# Patient Record
Sex: Male | Born: 2000 | Race: Black or African American | Hispanic: No | Marital: Single
Health system: Southern US, Community
[De-identification: ages and names within clinical notes are randomized; demographics above are authoritative.]

## PROBLEM LIST (undated history)

## (undated) DIAGNOSIS — F909 Attention-deficit hyperactivity disorder, unspecified type: Secondary | ICD-10-CM

## (undated) DIAGNOSIS — T7840XA Allergy, unspecified, initial encounter: Secondary | ICD-10-CM

## (undated) DIAGNOSIS — H539 Unspecified visual disturbance: Secondary | ICD-10-CM

## (undated) HISTORY — DX: Unspecified visual disturbance: H53.9

## (undated) HISTORY — DX: Allergy, unspecified, initial encounter: T78.40XA

---
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
## 2022-03-11 ENCOUNTER — Emergency Department (HOSPITAL_COMMUNITY)
Admission: EM | Admit: 2022-03-11 | Discharge: 2022-03-11 | Disposition: A | Discharge status: Home / Self Care | Payer: Self-pay | Attending: Emergency Medicine | Admitting: Emergency Medicine

## 2022-03-11 ENCOUNTER — Other Ambulatory Visit: Payer: Self-pay

## 2022-03-11 ENCOUNTER — Encounter (HOSPITAL_COMMUNITY): Payer: Self-pay

## 2022-03-11 DIAGNOSIS — H5713 Ocular pain, bilateral: Secondary | ICD-10-CM | POA: Insufficient documentation

## 2022-03-11 DIAGNOSIS — R519 Headache, unspecified: Secondary | ICD-10-CM | POA: Insufficient documentation

## 2022-03-11 DIAGNOSIS — H53149 Visual discomfort, unspecified: Secondary | ICD-10-CM | POA: Insufficient documentation

## 2022-03-11 MED ORDER — FLUORESCEIN SODIUM 1 MG OP STRP
1.0000 | ORAL_STRIP | Freq: Once | OPHTHALMIC | Status: AC
  Administered 2022-03-11: 1 via OPHTHALMIC
  Filled 2022-03-11: qty 1

## 2022-03-11 MED ORDER — TETRACAINE HCL 0.5 % OP SOLN
2.0000 [drp] | Freq: Once | OPHTHALMIC | Status: AC
  Administered 2022-03-11: 2 [drp] via OPHTHALMIC
  Filled 2022-03-11: qty 4

## 2022-03-11 MED ORDER — ACETAMINOPHEN 500 MG PO TABS
1000.0000 mg | ORAL_TABLET | Freq: Once | ORAL | Status: AC
  Administered 2022-03-11: 1000 mg via ORAL
  Filled 2022-03-11: qty 2

## 2022-03-11 NOTE — Discharge Instructions (Signed)
You came to the emergency department today to be evaluated for your eye pain.  Please go directly to [facility] for further evaluation of your pain by a ophthalmologist.  The exact cause of your eye pain is unknown at this time, if you do not follow-up with the ophthalmologist there is a chance that your eye pain can become worse and you may develop complications including but not limited to loss of vision.
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
## 2022-03-11 NOTE — ED Triage Notes (Signed)
Pt arrived POV from home c/o eye pain tht started yesterday. Pt states his eye was irritated all day yesterday but then today woke up and is sensitive to light and has a bad headache. Pt states he is unable to open his eyes d/t pain and light sensitivity.

## 2022-07-17 ENCOUNTER — Emergency Department (HOSPITAL_COMMUNITY): Payer: Self-pay

## 2022-07-17 ENCOUNTER — Emergency Department (HOSPITAL_COMMUNITY)
Admission: EM | Admit: 2022-07-17 | Discharge: 2022-07-18 | Disposition: A | Discharge status: Home / Self Care | Payer: Self-pay | Attending: Emergency Medicine | Admitting: Emergency Medicine

## 2022-07-17 DIAGNOSIS — S0990XA Unspecified injury of head, initial encounter: Secondary | ICD-10-CM | POA: Insufficient documentation

## 2022-07-17 DIAGNOSIS — F10220 Alcohol dependence with intoxication, uncomplicated: Secondary | ICD-10-CM | POA: Insufficient documentation

## 2022-07-17 DIAGNOSIS — Y92149 Unspecified place in prison as the place of occurrence of the external cause: Secondary | ICD-10-CM | POA: Diagnosis not present

## 2022-07-17 DIAGNOSIS — W2201XA Walked into wall, initial encounter: Secondary | ICD-10-CM | POA: Insufficient documentation

## 2022-07-17 DIAGNOSIS — R410 Disorientation, unspecified: Secondary | ICD-10-CM | POA: Diagnosis present

## 2022-07-17 DIAGNOSIS — S098XXA Other specified injuries of head, initial encounter: Secondary | ICD-10-CM

## 2022-07-17 DIAGNOSIS — F10929 Alcohol use, unspecified with intoxication, unspecified: Secondary | ICD-10-CM

## 2022-07-17 LAB — BASIC METABOLIC PANEL
Anion gap: 12 (ref 5–15)
BUN: 8 mg/dL (ref 6–20)
CO2: 24 mmol/L (ref 22–32)
Calcium: 9 mg/dL (ref 8.9–10.3)
Chloride: 107 mmol/L (ref 98–111)
Creatinine, Ser: 0.85 mg/dL (ref 0.61–1.24)
GFR, Estimated: >60 mL/min (ref >60)
Glucose, Bld: 98 mg/dL (ref 70–99)
Potassium: 3.6 mmol/L (ref 3.5–5.1)
Sodium: 143 mmol/L (ref 135–145)

## 2022-07-17 LAB — CBC WITH DIFFERENTIAL/PLATELET
Abs Immature Granulocytes: 0.02 10*3/uL (ref 0.00–0.07)
Basophils Absolute: 0 10*3/uL (ref 0.0–0.1)
Basophils Relative: 1 %
Eosinophils Absolute: 0.2 10*3/uL (ref 0.0–0.5)
Eosinophils Relative: 3 %
HCT: 41 % (ref 39.0–52.0)
Hemoglobin: 14.3 g/dL (ref 13.0–17.0)
Immature Granulocytes: 0 %
Lymphocytes Relative: 32 %
Lymphs Abs: 1.9 10*3/uL (ref 0.7–4.0)
MCH: 31.8 pg (ref 26.0–34.0)
MCHC: 34.9 g/dL (ref 30.0–36.0)
MCV: 91.1 fL (ref 80.0–100.0)
Monocytes Absolute: 0.5 10*3/uL (ref 0.1–1.0)
Monocytes Relative: 8 %
Neutro Abs: 3.3 10*3/uL (ref 1.7–7.7)
Neutrophils Relative %: 56 %
Platelets: 317 10*3/uL (ref 150–400)
RBC: 4.5 MIL/uL (ref 4.22–5.81)
RDW: 12.1 % (ref 11.5–15.5)
WBC: 5.9 10*3/uL (ref 4.0–10.5)
nRBC: 0 % (ref 0.0–0.2)

## 2022-07-17 LAB — RAPID URINE DRUG SCREEN, HOSP PERFORMED
Amphetamines: NOT DETECTED
Barbiturates: NOT DETECTED
Benzodiazepines: POSITIVE — AB
Cocaine: NOT DETECTED
Opiates: NOT DETECTED
Tetrahydrocannabinol: POSITIVE — AB

## 2022-07-17 LAB — ETHANOL: Alcohol, Ethyl (B): 187 mg/dL — ABNORMAL HIGH (ref <10)

## 2022-07-17 LAB — MAGNESIUM: Magnesium: 2 mg/dL (ref 1.7–2.4)

## 2022-07-17 MED ORDER — SODIUM CHLORIDE 0.9 % IV BOLUS
1000.0000 mL | Freq: Once | INTRAVENOUS | Status: AC
  Administered 2022-07-17: 1000 mL via INTRAVENOUS

## 2022-07-17 MED ORDER — DIPHENHYDRAMINE HCL 50 MG/ML IJ SOLN
50.0000 mg | Freq: Once | INTRAMUSCULAR | Status: AC
Start: 2022-07-17 — End: 2022-07-17
  Administered 2022-07-17: 50 mg via INTRAMUSCULAR
  Filled 2022-07-17: qty 1

## 2022-07-17 MED ORDER — HALOPERIDOL LACTATE 5 MG/ML IJ SOLN
2.0000 mg | Freq: Once | INTRAMUSCULAR | Status: AC
  Administered 2022-07-17: 2 mg via INTRAMUSCULAR
  Filled 2022-07-17: qty 1

## 2022-07-17 MED ORDER — LORAZEPAM 2 MG/ML IJ SOLN: 2.0000 mg | Freq: Once | INTRAMUSCULAR | Status: DC

## 2022-07-17 MED ORDER — LORAZEPAM 2 MG/ML IJ SOLN
2.0000 mg | Freq: Once | INTRAMUSCULAR | Status: AC
  Administered 2022-07-17: 2 mg via INTRAMUSCULAR
  Filled 2022-07-17: qty 1
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
## 2022-07-17 NOTE — ED Notes (Signed)
Patient assisted to urinate using a urinal . Patient continues to be agitated /combative and attempting to get out of bed.
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
## 2022-07-17 NOTE — Discharge Instructions (Addendum)
Follow up with your doctor
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
## 2022-07-30 NOTE — ED Provider Notes (Signed)
[facility]ERGENCY DEPARTMENT Provider Note   CSN: [id number] Arrival date & time: 07/17/22  1909     History  Chief Complaint  Patient presents with   Combative / ? Drug Use    In Custody / Handcuffed    Ricardo Joseph is a 21 y.o. male.  Patient is a 21 year old male presenting in police custody for agitation and confusion.  Patient was reportedly brought to jail and shortly after started slamming his head against the wall spitting on the officers. Patient previously reported using "molly" today officers today.  At this time patient is thrashing around in the bed, requiring over 5 officers to hold the patient down, spitting on staff members, and yelling.  Unable to verbally de-escalate at this time.  Some erythema to the forehead but no lacerations or abrasions.  The history is provided by the patient. No language interpreter was used.       Home Medications Prior to Admission medications   Medication Sig Start Date End Date Taking? Authorizing Provider  acetaminophen (TYLENOL) 500 MG tablet Take 1,500 mg by mouth 2 (two) times daily as needed for mild pain or moderate pain.    [provider]  Polyethyl Glycol-Propyl Glycol (SYSTANE OP) Place 1 drop into the right eye daily as needed (irritation).    [provider]      Allergies    Patient has no known allergies.    Review of Systems   Review of Systems  Unable to perform ROS: Mental status change  Psychiatric/Behavioral:  Positive for agitation.     Physical Exam Updated Vital Signs BP (!) 103/56   Pulse 60   Temp (!) 97.2 F (36.2 C) (Axillary)   Resp 16   SpO2 96%  Physical Exam Vitals and nursing note reviewed.  Constitutional:      Appearance: He is well-developed.  HENT:     Head: Normocephalic and atraumatic.   Eyes:     Conjunctiva/sclera: Conjunctivae normal.  Cardiovascular:     Rate and Rhythm: Normal rate and regular rhythm.     Heart sounds: No  murmur heard. Pulmonary:     Effort: Pulmonary effort is normal. No respiratory distress.     Breath sounds: Normal breath sounds.  Abdominal:     Palpations: Abdomen is soft.     Tenderness: There is no abdominal tenderness.  Musculoskeletal:        General: No swelling.     Cervical back: Neck supple. No bony tenderness.     Thoracic back: No bony tenderness.     Lumbar back: No bony tenderness.  Skin:    General: Skin is warm and dry.     Capillary Refill: Capillary refill takes less than 2 seconds.  Neurological:     Mental Status: He is alert. He is disoriented and confused.     GCS: GCS eye subscore is 4. GCS verbal subscore is 4. GCS motor subscore is 6.     Comments: Does not cooperate with neurological exam but is moving all 4 extremities spontaneously and without difficulty.  Psychiatric:        Mood and Affect: Mood normal. Affect is angry.        Behavior: Behavior is agitated and aggressive.     ED Results / Procedures / Treatments   Labs (all labs ordered are listed, but only abnormal results are displayed) Labs Reviewed  ETHANOL - Abnormal; Notable for the following components:  Result Value   Alcohol, Ethyl (B) 187 (*)    All other components within normal limits  RAPID URINE DRUG SCREEN, HOSP PERFORMED - Abnormal; Notable for the following components:   Benzodiazepines POSITIVE (*)    Tetrahydrocannabinol POSITIVE (*)    All other components within normal limits  CBC WITH DIFFERENTIAL/PLATELET  BASIC METABOLIC PANEL  MAGNESIUM    EKG EKG Interpretation  Date/Time:  Friday July 17 2022 20:20:34 EDT Ventricular Rate:  81 PR Interval:  156 QRS Duration: 99 QT Interval:  370 QTC Calculation: 430 R Axis:   74 Text Interpretation: Sinus rhythm Consider left atrial enlargement Borderline ST elevation, anterolateral leads No old tracing to compare Confirmed by Melene Plan [phone]) on 07/17/2022 11:25:48 PM  Radiology No results  found.  Procedures Procedures    Medications Ordered in ED Medications  haloperidol lactate (HALDOL) injection 2 mg (2 mg Intramuscular Given 07/17/22 1923)  diphenhydrAMINE (BENADRYL) injection 50 mg (50 mg Intramuscular Given 07/17/22 1923)  LORazepam (ATIVAN) injection 2 mg (2 mg Intramuscular Given 07/17/22 2013)  sodium chloride 0.9 % bolus 1,000 mL (0 mLs Intravenous Stopped 07/18/22 0041)    ED Course/ Medical Decision Making/ A&P Clinical Course as of 07/30/22 2500  Fri Jul 17, 2022  1916 Patient is brought to the emergency department due to concern for about a behavior versus TBI.  Per EMS report and please officers, patient was reportedly brought to the emergency department after being placed in custody today.  Patient was arrested by police and brought to jail.  He reports that he did "[employer]" earlier in the day.  Unfortunate states that he repeatedly bashed his head against the wall at the jail which is what prompted the police department to bring the patient to the ED for evaluation.  Per EMS, patient was combative and agitated.  He required 5 mg of IM Versed due to agitation and being violent with EMS. [JN]  2136 On arrival to the emergency department, patient was agitated and aggressive, patient was in handcuffs by PD.  Patient required chemical sedation due to agitation.  Patient was given Haldol and Benadryl as he had already received Versed with EMS.  Unfortunately patient was still extremely agitated and required another 2 mg of IV Ativan.  Patient was then appropriately sedated placed in soft restraints. [JN]  2137 Between imaging study work-up obtained due to patient's reported self-induced head injury, CT head will be ordered to assess patient intracranial normality. [JN]  2137 EKG unremarkable, QRS/QTc/PR interval appropriate, no evidence of ST elevation or depression. [JN]    Clinical Course User Index [JN] Nogle, [place], MD                           Medical Decision  Making Amount and/or Complexity of Data Reviewed Labs: ordered. Radiology: ordered.  Risk Prescription drug management.   34:13 AM 21 year old male presenting in police custody for agitation and confusion.  Patient is aggressive, confused, yelling, spitting, and swearing.  Unable to be verbally de-escalated at this time.  Will order restraints and use chemical sedation to ensure no TBI after blunt head injury today in jail.  Patient resting comfortably in bed at this time. CT head demonstrates no acute process. Ethanol positive.  UDS positive for THC and benzos.  Patient signed out to oncoming physician while awaiting for sedating medications to wear off for reevaluation of an neurological exam.  For discharge to police custody if normal.  Final Clinical Impression(s) / ED Diagnoses Final diagnoses:  Alcoholic intoxication with complication [facility])  Blunt head trauma, initial encounter    Rx / DC Orders ED Discharge Orders     None         Franne Forts, DO 07/30/22 [phone]

## 2022-08-28 ENCOUNTER — Encounter: Payer: Self-pay | Admitting: *Deleted

## 2022-08-28 NOTE — Congregational Nurse Program (Signed)
  Dept: [phone]   Congregational Nurse Program Note  Date of Encounter: 08/28/2022  Past Medical History: Past Medical History:  Diagnosis Date   Allergy    Vision abnormalities    wears contacts    Encounter Details:  CNP Questionnaire - 08/28/22 1403       Questionnaire   Ask client: Do you give verbal consent for me to treat you today? Yes    Student Assistance N/A    Location Patient Served  [facility]    Visit Setting with Client Organization;Phone/Text/Email    Patient Status Unknown    Insurance Unknown   see CN note   Insurance/Financial Assistance Referral N/A    Medication N/A    Medical Provider No    Screening Referrals Made N/A    Medical Referrals Made [facility]    Medical Appointment Made N/A    Recently w/o PCP, now 1st time PCP visit completed due to CNs referral or appointment made N/A    Food N/A    Transportation N/A    Housing/Utilities N/A    Interpersonal Safety N/A    Interventions Advocate/Support;Navigate Healthcare System    Abnormal to Normal Screening Since Last CN Visit N/A    Screenings CN Performed N/A    Sent Client to Lab for: N/A    Did client attend any of the following based off CNs referral or appointments made? N/A    ED Visit Averted N/A    Life-Saving Intervention Made N/A            Client came to nurse's office asking about behavioral health services. Client reports he has had increased mood swings and conflicts with others wanting help with anger.Client is working and reports he should be eligible for insurance in a month. Contacted [facility] OP office and left message to call client. Gave [facility] information to client. He denies si and hi. Ricardo Joseph W RN CN

## 2022-09-26 ENCOUNTER — Ambulatory Visit (HOSPITAL_COMMUNITY)
Admission: EM | Admit: 2022-09-26 | Discharge: 2022-09-26 | Disposition: A | Discharge status: Home / Self Care | Payer: No Payment, Other | Attending: Student | Admitting: Student

## 2022-09-26 DIAGNOSIS — F4310 Post-traumatic stress disorder, unspecified: Secondary | ICD-10-CM | POA: Insufficient documentation

## 2022-09-26 DIAGNOSIS — R4587 Impulsiveness: Secondary | ICD-10-CM | POA: Insufficient documentation

## 2022-09-26 DIAGNOSIS — R4586 Emotional lability: Secondary | ICD-10-CM | POA: Insufficient documentation

## 2022-09-26 DIAGNOSIS — R454 Irritability and anger: Secondary | ICD-10-CM

## 2022-09-26 MED ORDER — ARIPIPRAZOLE 5 MG PO TABS: 5.0000 mg | ORAL_TABLET | Freq: Every day | ORAL | 0 refills | Status: DC

## 2022-09-26 NOTE — Progress Notes (Signed)
Ricardo Joseph,  Routine: is a 21 year old male  who presents voluntarily at [facility]  requesting an evaluation for possible bipolar dx. Pt. was denies any SI/HI/AVH.  Pt. reports a dx of ADHD with a strong family hx of bipolar .  He denies any recent substance use but will sometimes drink excessively to numb his emotions. Pt. was alert and oriented, engaged.    09/26/22 1514  [facility] Triage Screening (Walk-ins at [facility] only)  What Is the Reason for Your Visit/Call Today? Ricardo Joseph is a 21 year old male  who presents voluntarily at [facility]  requesting an evaluation for possible bipolar dx. Pt. was denies any SI/HI/AVH Pt. reports a dx of ADHD but a strong family hx of bipolar.  He denies any recent substance use but will sometimes drink excessively to numb his emotions. Pt. was alert and oriented, engaged  How Long Has This Been Causing You Problems? > than 6 months  Have You Recently Had Any Thoughts About Hurting Yourself? No  Are You Planning to Commit Suicide/Harm Yourself At This time? No  Have you Recently Had Thoughts About Hurting Someone Karolee Ohs? No  Are You Planning To Harm Someone At This Time? No  Are you currently experiencing any auditory, visual or other hallucinations? No  Have You Used Any Alcohol or Drugs in the Past 24 Hours? No (Pt. admitted he sometimes will drink to numb his emotions)  Do you have any current medical co-morbidities that require immediate attention? No  What Do You Feel Would Help You the Most Today? Treatment for Depression or other mood problem  If access to [facility] was not available, would you have sought care in the Emergency Department? No  Determination of Need Routine (7 days)  Options For Referral Medication Management;Outpatient Therapy

## 2022-09-26 NOTE — ED Provider Notes (Cosign Needed)
Behavioral Health Urgent Care Medical Screening Exam  Patient Name: Ricardo Joseph MRN: [id number] Date of Evaluation: 09/26/22 Chief Complaint:  "evaluation for bipolar disorder" Diagnosis:  Final diagnoses:  PTSD (post-traumatic stress disorder)  Difficulty controlling anger    History of Present illness: Ricardo Joseph is a 21 y.o. male with a psychiatric history of ADHD, unspecified mood disorder, aggressive behaviors, alcohol use disorder, and marijuana use disorder who presented to the [facility] voluntarily for "evaluation for bipolar disorder."  On assessment today, patient reports mood lability that often results in anger which he feels is out of his control. This has led to him making decisions that have resulted in arrests. Patient reports a primary trigger to be someone raising a hand to him, which reminds him of the physical, emotional, and verbal trauma endured in childhood, then he develops difficulty deciphering between past and present, reality and memories. As well during this time, patient has an increase in alcohol intake and marijuana use to "numb the pain." He reports that he used to have nightmares and used to hear voices as a child, but it has been a while since. In addition to trauma, he reports that he was involved with the wrong crowd and was imprisoned at 59 or 21 years old for four years. Since, he has found it difficult to control and properly process his emotions. Patient does report fear about his anger, stating that when it escalates, he has no regard for anyone else.   Patient denies major depressive episodes in which depressive symptoms persisted for at least 2 weeks. They last a maximum of 2-3 days. He also denies symptoms of mania including impulsivity, grandiosity, decreased need for sleep, increased energy, and talkativeness ongoing for days on end. These symptoms typically last no more than 24 hours. He makes impulsive decisions, but these appear to be more so  one day at a time rather than ongoing.   Zanarini Rating Scale for Boderline Personality Disorder: 7.  Flowsheet Row ED from 09/26/2022 in [facility] ED from 03/11/2022 in [facility] EMERGENCY DEPARTMENT  C-SSRS RISK CATEGORY No Risk No Risk       Psychiatric Specialty Exam  Presentation  General Appearance:Appropriate for Environment; Well Groomed  Eye Contact:Good  Speech:Clear and Coherent; Normal Rate  Speech Volume:Normal  Handedness:Right   Mood and Affect  Mood:Anxious; Angry  Affect:Congruent   Thought Process  Thought Processes:Goal Directed  Descriptions of Associations:Intact  Orientation:Full (Time, Place and Person)  Thought Content:Perseveration    Hallucinations:None  Ideas of Reference:None  Suicidal Thoughts:Yes, Passive Without Intent; Without Plan  Homicidal Thoughts:No   Sensorium  Memory:Immediate Good; Recent Good  Judgment:Fair  Insight:Poor; Shallow   Executive Functions  Concentration:Good  Attention Span:Good  Recall:Good  Fund of Knowledge:Fair  Language:Good   Psychomotor Activity  Psychomotor Activity:Normal   Assets  Assets:Housing; Desire for Improvement; Physical Health; Resilience; Social Support; Vocational/Educational   Sleep  Sleep:Good  Number of hours: No data recorded  No data recorded  Physical Exam: Physical Exam Vitals reviewed.  Constitutional:      General: He is not in acute distress.    Appearance: He is not ill-appearing.  HENT:     Head: Normocephalic and atraumatic.     Mouth/Throat:     Mouth: Mucous membranes are moist.     Pharynx: Oropharynx is clear.  Pulmonary:     Effort: Pulmonary effort is normal.  Skin:    General: Skin is warm and dry.  Neurological:     General: No focal deficit present.     Mental Status: He is alert.     Motor: No weakness.     Gait: Gait normal.    Review of Systems  Constitutional:   Negative for malaise/fatigue.  HENT:  Negative for congestion.   Cardiovascular:  Positive for palpitations. Negative for chest pain.       Palpitations 2/2 anxiety  Gastrointestinal: Negative.   Genitourinary: Negative.   Neurological:  Negative for dizziness, weakness and headaches.   There were no vitals taken for this visit. There is no height or weight on file to calculate BMI.  Musculoskeletal: Strength & Muscle Tone: within normal limits Gait & Station: normal Patient leans: N/A   [facility] MSE Discharge Disposition for Follow up and Recommendations: Based on my evaluation the patient does not appear to have an emergency medical condition and can be discharged with resources and follow up care in outpatient services for Medication Management and Individual Therapy  Based on assessment, patient does not have a diagnosis of bipolar disorder. Rather, his presentation is consistent with PTSD. As well, he has difficulty controlling his anger. Patient has not seen a therapist and has not seen a psychiatrist since he was a child. We discuss at length the need for medication management and therapy. He is initially reluctant to seek further care but is ultimately agreeable.  -Patient started on Abilify 5 mg daily.  -Patient scheduled to follow-up with this writer Feb 7 at 2 PM.  -Patient advised to walk-in for therapy or if needs to be seen sooner for medication management.  -Patient given return to hospital protocol.  -Patient counseled on marijuana, alcohol, and tobacco cessation.    Lamar Sprinkles, MD PGY-2 09/26/2022, 9:00 PM

## 2022-09-26 NOTE — Discharge Instructions (Addendum)
Follow-up recommendations:  Activity:  Normal, as tolerated Diet:  Per PCP recommendation  Patient is instructed prior to discharge to: Take all medications as prescribed by his mental healthcare provider. Report any adverse effects and/or reactions from the medicines to his outpatient provider promptly. Patient has been instructed & cautioned: To not engage in alcohol and or illegal drug use while on prescription medicines.  In the event of worsening symptoms, patient is instructed to call the crisis hotline at 988, 911 and or go to the nearest ED for appropriate evaluation and treatment of symptoms. To follow-up with his primary care provider for your other medical issues, concerns and or health care needs.   [facility] OUTPATIENT Walk-in information:  Please note, all walk-ins are first come & first serve, with limited number of availability. Therapist for therapy:  Monday & Wednesdays: Please ARRIVE at 7:15 AM for registration Will START at 8:00 AM Every 1st & 2nd Friday of the month: Please ARRIVE at 10:15 AM for registration Will START at 1 PM - 5 PM Psychiatrist for medication management: Monday - Friday:  Please ARRIVE at 7:15 AM for registration Will START at 8:00 AM       Regretfully, due to limited availability, please be aware that you may not been seen on the same day as walk-in. Please consider making an appoint or try again. Thank you for your patience and understanding.  

## 2022-09-29 ENCOUNTER — Ambulatory Visit (HOSPITAL_COMMUNITY)
Admission: EM | Admit: 2022-09-29 | Discharge: 2022-09-29 | Disposition: A | Discharge status: Home / Self Care | Payer: No Payment, Other | Attending: Psychiatry | Admitting: Psychiatry

## 2022-09-29 DIAGNOSIS — Z818 Family history of other mental and behavioral disorders: Secondary | ICD-10-CM | POA: Insufficient documentation

## 2022-09-29 DIAGNOSIS — F4325 Adjustment disorder with mixed disturbance of emotions and conduct: Secondary | ICD-10-CM | POA: Insufficient documentation
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
## 2022-09-29 NOTE — Discharge Instructions (Addendum)

## 2022-11-04 ENCOUNTER — Ambulatory Visit (HOSPITAL_COMMUNITY): Payer: No Payment, Other | Admitting: Student

## 2023-01-24 ENCOUNTER — Emergency Department (HOSPITAL_COMMUNITY): Payer: Medicaid Other

## 2023-01-24 ENCOUNTER — Other Ambulatory Visit: Payer: Self-pay

## 2023-01-24 ENCOUNTER — Emergency Department (HOSPITAL_COMMUNITY)
Admission: EM | Admit: 2023-01-24 | Discharge: 2023-01-24 | Disposition: A | Discharge status: Home / Self Care | Payer: Medicaid Other | Attending: Emergency Medicine | Admitting: Emergency Medicine

## 2023-01-24 ENCOUNTER — Encounter (HOSPITAL_COMMUNITY): Payer: Self-pay

## 2023-01-24 DIAGNOSIS — W3400XA Accidental discharge from unspecified firearms or gun, initial encounter: Secondary | ICD-10-CM

## 2023-01-24 DIAGNOSIS — S71132D Puncture wound without foreign body, left thigh, subsequent encounter: Secondary | ICD-10-CM

## 2023-01-24 DIAGNOSIS — S71142A Puncture wound with foreign body, left thigh, initial encounter: Secondary | ICD-10-CM | POA: Insufficient documentation

## 2023-01-24 DIAGNOSIS — M79642 Pain in left hand: Secondary | ICD-10-CM | POA: Diagnosis not present

## 2023-01-24 DIAGNOSIS — S71102D Unspecified open wound, left thigh, subsequent encounter: Secondary | ICD-10-CM | POA: Diagnosis present

## 2023-01-24 DIAGNOSIS — Z23 Encounter for immunization: Secondary | ICD-10-CM | POA: Insufficient documentation

## 2023-01-24 DIAGNOSIS — W3400XD Accidental discharge from unspecified firearms or gun, subsequent encounter: Secondary | ICD-10-CM | POA: Diagnosis not present

## 2023-01-24 HISTORY — DX: Attention-deficit hyperactivity disorder, unspecified type: F90.9

## 2023-01-24 LAB — BASIC METABOLIC PANEL
Anion gap: 16 — ABNORMAL HIGH (ref 5–15)
BUN: 13 mg/dL (ref 6–20)
CO2: 19 mmol/L — ABNORMAL LOW (ref 22–32)
Calcium: 9.2 mg/dL (ref 8.9–10.3)
Chloride: 103 mmol/L (ref 98–111)
Creatinine, Ser: 1.37 mg/dL — ABNORMAL HIGH (ref 0.61–1.24)
Glucose, Bld: 138 mg/dL — ABNORMAL HIGH (ref 70–99)
Potassium: 3.2 mmol/L — ABNORMAL LOW (ref 3.5–5.1)
Sodium: 138 mmol/L (ref 135–145)

## 2023-01-24 LAB — I-STAT VENOUS BLOOD GAS, ED
Acid-base deficit: 6 mmol/L — ABNORMAL HIGH (ref 0.0–2.0)
Bicarbonate: 20.8 mmol/L (ref 20.0–28.0)
Calcium, Ion: 1.17 mmol/L (ref 1.15–1.40)
HCT: 49 % (ref 39.0–52.0)
Hemoglobin: 16.7 g/dL (ref 13.0–17.0)
O2 Saturation: 63 %
Potassium: 3.4 mmol/L — ABNORMAL LOW (ref 3.5–5.1)
Sodium: 142 mmol/L (ref 135–145)
TCO2: 22 mmol/L (ref 22–32)
pCO2, Ven: 42.3 mmHg — ABNORMAL LOW (ref 44–60)
pH, Ven: 7.299 (ref 7.25–7.43)
pO2, Ven: 36 mmHg (ref 32–45)

## 2023-01-24 LAB — CBC WITH DIFFERENTIAL/PLATELET
Abs Immature Granulocytes: 0.02 10*3/uL (ref 0.00–0.07)
Basophils Absolute: 0 10*3/uL (ref 0.0–0.1)
Basophils Relative: 1 %
Eosinophils Absolute: 0.3 10*3/uL (ref 0.0–0.5)
Eosinophils Relative: 3 %
HCT: 46.4 % (ref 39.0–52.0)
Hemoglobin: 16.3 g/dL (ref 13.0–17.0)
Immature Granulocytes: 0 %
Lymphocytes Relative: 56 %
Lymphs Abs: 4.7 10*3/uL — ABNORMAL HIGH (ref 0.7–4.0)
MCH: 32.1 pg (ref 26.0–34.0)
MCHC: 35.1 g/dL (ref 30.0–36.0)
MCV: 91.3 fL (ref 80.0–100.0)
Monocytes Absolute: 0.6 10*3/uL (ref 0.1–1.0)
Monocytes Relative: 7 %
Neutro Abs: 2.7 10*3/uL (ref 1.7–7.7)
Neutrophils Relative %: 33 %
Platelets: 318 10*3/uL (ref 150–400)
RBC: 5.08 MIL/uL (ref 4.22–5.81)
RDW: 11.8 % (ref 11.5–15.5)
WBC: 8.3 10*3/uL (ref 4.0–10.5)
nRBC: 0 % (ref 0.0–0.2)

## 2023-01-24 LAB — CBG MONITORING, ED: Glucose-Capillary: 126 mg/dL — ABNORMAL HIGH (ref 70–99)

## 2023-01-24 MED ORDER — FENTANYL CITRATE PF 50 MCG/ML IJ SOSY
100.0000 ug | PREFILLED_SYRINGE | Freq: Once | INTRAMUSCULAR | Status: AC
  Administered 2023-01-24: 100 ug via INTRAVENOUS
  Filled 2023-01-24: qty 2

## 2023-01-24 MED ORDER — IOHEXOL 350 MG/ML SOLN
150.0000 mL | Freq: Once | INTRAVENOUS | Status: AC | PRN
  Administered 2023-01-24: 150 mL via INTRAVENOUS

## 2023-01-24 MED ORDER — IBUPROFEN 600 MG PO TABS: 600.0000 mg | ORAL_TABLET | Freq: Four times a day (QID) | ORAL | 0 refills | Status: DC | PRN

## 2023-01-24 MED ORDER — CYCLOBENZAPRINE HCL 10 MG PO TABS
10.0000 mg | ORAL_TABLET | Freq: Two times a day (BID) | ORAL | 0 refills | Status: DC | PRN
Start: 2023-01-24 — End: 2023-01-25

## 2023-01-24 MED ORDER — TETANUS-DIPHTH-ACELL PERTUSSIS 5-2.5-18.5 LF-MCG/0.5 IM SUSY
0.5000 mL | PREFILLED_SYRINGE | Freq: Once | INTRAMUSCULAR | Status: AC
  Administered 2023-01-24: 0.5 mL via INTRAMUSCULAR
  Filled 2023-01-24: qty 0.5

## 2023-01-24 MED ORDER — FENTANYL CITRATE PF 50 MCG/ML IJ SOSY
PREFILLED_SYRINGE | INTRAMUSCULAR | Status: AC
  Filled 2023-01-24: qty 2

## 2023-01-24 MED ORDER — ACETAMINOPHEN 500 MG PO TABS: 1000.0000 mg | ORAL_TABLET | Freq: Two times a day (BID) | ORAL | 0 refills | Status: DC | PRN

## 2023-01-24 MED ORDER — OXYCODONE-ACETAMINOPHEN 5-325 MG PO TABS
2.0000 | ORAL_TABLET | Freq: Once | ORAL | Status: AC
  Administered 2023-01-24: 2 via ORAL
  Filled 2023-01-24: qty 2
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
## 2023-01-24 NOTE — Progress Notes (Signed)
Orthopedic Tech Progress Note Patient Details:  Ricardo Joseph 02-18-2001 [id number]  Patient ID: [name], male   DOB: 03/17/2001, 22 y.o.   MRN: [id number] I attended trauma page. Trinna Post 01/24/2023, 6:17 AM
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
## 2023-01-25 ENCOUNTER — Ambulatory Visit (HOSPITAL_COMMUNITY)
Admission: EM | Admit: 2023-01-25 | Discharge: 2023-01-25 | Disposition: A | Discharge status: Home / Self Care | Payer: No Payment, Other | Attending: Behavioral Health | Admitting: Behavioral Health

## 2023-01-25 ENCOUNTER — Encounter (HOSPITAL_COMMUNITY): Payer: Self-pay | Admitting: Behavioral Health

## 2023-01-25 ENCOUNTER — Encounter (HOSPITAL_COMMUNITY): Payer: Self-pay

## 2023-01-25 DIAGNOSIS — R45850 Homicidal ideations: Secondary | ICD-10-CM

## 2023-01-25 MED ORDER — ARIPIPRAZOLE 5 MG PO TABS: 5.0000 mg | ORAL_TABLET | Freq: Every day | ORAL | Status: DC

## 2023-01-25 MED ORDER — ACETAMINOPHEN 325 MG PO TABS: 650.0000 mg | ORAL_TABLET | Freq: Four times a day (QID) | ORAL | Status: DC | PRN

## 2023-01-25 MED ORDER — MAGNESIUM HYDROXIDE 400 MG/5ML PO SUSP: 30.0000 mL | Freq: Every day | ORAL | Status: DC | PRN

## 2023-01-25 MED ORDER — TRAZODONE HCL 50 MG PO TABS: 50.0000 mg | ORAL_TABLET | Freq: Every evening | ORAL | Status: DC | PRN

## 2023-01-25 MED ORDER — HYDROXYZINE HCL 25 MG PO TABS: 25.0000 mg | ORAL_TABLET | Freq: Three times a day (TID) | ORAL | Status: DC | PRN

## 2023-01-25 MED ORDER — ALUM & MAG HYDROXIDE-SIMETH 200-200-20 MG/5ML PO SUSP: 30.0000 mL | ORAL | Status: DC | PRN

## 2023-01-25 NOTE — Discharge Instructions (Addendum)
[facility]: Outpatient psychiatric Services  New Patient Assessment and Therapy Walk-in Monday thru Thursday 8:00 am first come first serve until slots are full Every Friday from 1:00 pm to 4:00 pm first come first serve until slots are full  New Patient Psychiatric Medication Management Monday thru Friday from 8:00 am to 11:00 am first come first served until slots are full  For all walk-ins we ask that you arrive by 7:15 am because patients will be seen in there order of arrival.   Availability is limited, and therefore you may not be seen on the same day that you walk in.  Our goal is to serve and meet the needs of our community to the best of our ability.     Based on what you have shared, a list of resources for outpatient therapy and psychiatry is provided below to get you started back on treatment.  It is imperative that you follow through with treatment within 5-7 days from the day of discharge to prevent any further risk to your safety or mental well-being.  You are not limited to the list provided.  In case of an urgent crisis, you may contact the Mobile Crisis Unit with [facility] at [phone].        Outpatient Services for Therapy and Medication Management for Medicaid  [facility] [address] [place], [place], [address] [phone] phone  New Patient Assessment/Therapy Walk-ins Monday and Wednesday: 8am until slots are full. Every 1st and 2nd Friday: 1pm - 5pm  NO ASSESSMENT/THERAPY WALK-INS ON TUESDAYS OR THURSDAYS  New Patient Psychiatry/Medication Management Walk-ins Monday-Friday: 8am-11am  For all walk-ins, we ask that you arrive by 7:30am because patient will be seen in the order of arrival.  Availability is limited; therefore, you may not be seen on the same day that you walk-in.  Our goal is to serve and meet the needs of our community to the best of our ability.   [facility] [address] [place], [place], [address] [phone] phone  [facility] [address] [place], [place], [address] [phone] phone [phone] phone (Aetna, AmeriHealth, Anthem/Elevance, BCBS, Centivo, Cigna/Evernorth, ComPsych, Healthy Blue, Medicaid, Optum, [facility], The Alliance, UMR, UHC, Vaya Health, WellCare, Out of Network)  [facility] [address] [place], [place], [address] [phone] phone (Aetna, Anthem/Elevance, [facility] Options/Carelon, BCBS, [facility], Humana, Magellan, MedCost, Medicaid, Medicare, MultiPlan, Tricare, UMR, UHC)  [facility] [address] [place], [place], [address] [phone] phone (Medicaid, ask about other insurance)  [facility] [address] [place], [place], [address] [phone] phone [phone] fax (Aetna, BCBS , Cigna, Medicaid, [place] Health Choice, UHC, TRICARE, Self-Pay)  [facility] [address] [place], [place], [address] [phone] phone (Aetna, Anthem/Elevance, BCBS, [facility], Cigna/Evernorth, Health Choice, Humana, MedCost, Medicaid, Medicare, Optum, Tricare, UMR, UHC)  [facility] - 6-8 MONTH WAIT FOR THERAPY; SOONER FOR MEDICATION MANAGEMENT [address] [place], [place], [address] [phone] phone (Aetna, AmeriHealth [facility] [place], BCBS, Cigna, Evernorth, Friday Health Plans, [facility], BCBS Healthy Blue, Humana, Magellan Health, Medcost, Medicare, Medicaid, Optum, Tricare, UHC, UHC Community Plan, Wellcare)  [facility] [address] [place], , [address] [phone] phone  [facility] [address] [place], , [address] [phone] phone  [facility] [address] [place], [place],  [address] [phone] phone  [facility] - THERAPY ONLY [address] [place], [place], [address] [phone] phone  [facility] [address] [place], [place], [address] [phone] phone  [facility] [address] [place], [place], [address] [phone] phone [phone] fax  [facility] [address] [place], [place], [address] [phone] phone [phone] fax  [facility] [address] [place], [place], [address] [phone] phone  [facility] [address] [place], [place], [address]  [phone] phone  [facility]  (Adults Only) [address] [place], [place], [address]  [phone] phone [phone] fax   

## 2023-01-25 NOTE — ED Notes (Signed)
Patient is being walked out by NP Marchelle Folks
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
## 2023-01-26 LAB — I-STAT CHEM 8, ED
BUN: 14 mg/dL (ref 6–20)
Calcium, Ion: 1.11 mmol/L — ABNORMAL LOW (ref 1.15–1.40)
Chloride: 104 mmol/L (ref 98–111)
Creatinine, Ser: 1.7 mg/dL — ABNORMAL HIGH (ref 0.61–1.24)
Glucose, Bld: 134 mg/dL — ABNORMAL HIGH (ref 70–99)
HCT: 48 % (ref 39.0–52.0)
Hemoglobin: 16.3 g/dL (ref 13.0–17.0)
Potassium: 3.4 mmol/L — ABNORMAL LOW (ref 3.5–5.1)
Sodium: 142 mmol/L (ref 135–145)
TCO2: 21 mmol/L — ABNORMAL LOW (ref 22–32)

## 2023-06-13 ENCOUNTER — Emergency Department (HOSPITAL_COMMUNITY)
Admission: EM | Admit: 2023-06-13 | Discharge: 2023-06-13 | Disposition: A | Discharge status: Home / Self Care | Payer: Self-pay | Attending: Emergency Medicine | Admitting: Emergency Medicine

## 2023-06-13 ENCOUNTER — Encounter (HOSPITAL_COMMUNITY): Payer: Self-pay

## 2023-06-13 ENCOUNTER — Other Ambulatory Visit: Payer: Self-pay

## 2023-06-13 ENCOUNTER — Emergency Department (HOSPITAL_COMMUNITY): Payer: Self-pay

## 2023-06-13 DIAGNOSIS — S0990XA Unspecified injury of head, initial encounter: Secondary | ICD-10-CM

## 2023-06-13 DIAGNOSIS — F10129 Alcohol abuse with intoxication, unspecified: Secondary | ICD-10-CM | POA: Insufficient documentation

## 2023-06-13 DIAGNOSIS — Y909 Presence of alcohol in blood, level not specified: Secondary | ICD-10-CM | POA: Insufficient documentation

## 2023-06-13 DIAGNOSIS — S0081XA Abrasion of other part of head, initial encounter: Secondary | ICD-10-CM | POA: Insufficient documentation

## 2023-06-13 DIAGNOSIS — S60511A Abrasion of right hand, initial encounter: Secondary | ICD-10-CM | POA: Insufficient documentation

## 2023-06-13 DIAGNOSIS — S40212A Abrasion of left shoulder, initial encounter: Secondary | ICD-10-CM | POA: Insufficient documentation

## 2023-06-13 DIAGNOSIS — S0012XA Contusion of left eyelid and periocular area, initial encounter: Secondary | ICD-10-CM | POA: Insufficient documentation

## 2023-06-13 DIAGNOSIS — F419 Anxiety disorder, unspecified: Secondary | ICD-10-CM | POA: Insufficient documentation

## 2023-06-13 DIAGNOSIS — S60512A Abrasion of left hand, initial encounter: Secondary | ICD-10-CM | POA: Insufficient documentation

## 2023-06-13 DIAGNOSIS — F10920 Alcohol use, unspecified with intoxication, uncomplicated: Secondary | ICD-10-CM

## 2023-06-13 DIAGNOSIS — S00412A Abrasion of left ear, initial encounter: Secondary | ICD-10-CM | POA: Insufficient documentation

## 2023-06-13 MED ORDER — ACETAMINOPHEN 325 MG PO TABS
650.0000 mg | ORAL_TABLET | Freq: Once | ORAL | Status: AC
  Administered 2023-06-13: 650 mg via ORAL
  Filled 2023-06-13: qty 2
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
## 2023-06-13 NOTE — ED Notes (Signed)
This Nurse Tech attempted to call patients mother and father. No answer called 2x

## 2023-06-13 NOTE — ED Notes (Signed)
Patient escorted to lobby with this RN; patient's mother was waiting for him in lobby.

## 2023-06-13 NOTE — ED Notes (Signed)
Pt ambulated without assistance and steady gait

## 2023-06-13 NOTE — ED Notes (Signed)
Pt mother answered phone. She will be here to pick him up in 20 mins. RN notify

## 2023-06-13 NOTE — ED Notes (Signed)
Visual acuity screening per MD order. Rt eye: 20/25 Lt eye: 20/50 Bilat eye: 20/25. RN advised. [employer]
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
## 2023-06-13 NOTE — ED Notes (Signed)
Patient transported to CT 

## 2023-06-13 NOTE — ED Notes (Signed)
This RN attempted to call patient's sister Judeth Cornfield to pick patient up; no answer, left voicemail to call ED back.
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
## 2024-11-01 ENCOUNTER — Other Ambulatory Visit: Payer: Self-pay

## 2024-11-01 ENCOUNTER — Emergency Department (HOSPITAL_COMMUNITY)
Admission: EM | Admit: 2024-11-01 | Discharge: 2024-11-03 | Disposition: A | Source: Home / Self Care | Attending: Emergency Medicine | Admitting: Emergency Medicine

## 2024-11-01 ENCOUNTER — Encounter (HOSPITAL_COMMUNITY): Payer: Self-pay

## 2024-11-01 DIAGNOSIS — Z046 Encounter for general psychiatric examination, requested by authority: Secondary | ICD-10-CM

## 2024-11-01 DIAGNOSIS — R451 Restlessness and agitation: Secondary | ICD-10-CM

## 2024-11-01 DIAGNOSIS — R4689 Other symptoms and signs involving appearance and behavior: Secondary | ICD-10-CM | POA: Insufficient documentation

## 2024-11-01 DIAGNOSIS — F1090 Alcohol use, unspecified, uncomplicated: Secondary | ICD-10-CM | POA: Insufficient documentation

## 2024-11-01 DIAGNOSIS — F1994 Other psychoactive substance use, unspecified with psychoactive substance-induced mood disorder: Secondary | ICD-10-CM | POA: Insufficient documentation

## 2024-11-01 LAB — COMPREHENSIVE METABOLIC PANEL WITH GFR
ALT: 24 U/L (ref 0–44)
AST: 44 U/L — ABNORMAL HIGH (ref 15–41)
Albumin: 4.6 g/dL (ref 3.5–5.0)
Alkaline Phosphatase: 74 U/L (ref 38–126)
Anion gap: 25 — ABNORMAL HIGH (ref 5–15)
BUN: 15 mg/dL (ref 6–20)
CO2: 15 mmol/L — ABNORMAL LOW (ref 22–32)
Calcium: 9.5 mg/dL (ref 8.9–10.3)
Chloride: 103 mmol/L (ref 98–111)
Creatinine, Ser: 1.17 mg/dL (ref 0.61–1.24)
GFR, Estimated: >60 mL/min
Glucose, Bld: 120 mg/dL — ABNORMAL HIGH (ref 70–99)
Potassium: 3.2 mmol/L — ABNORMAL LOW (ref 3.5–5.1)
Sodium: 143 mmol/L (ref 135–145)
Total Bilirubin: 0.2 mg/dL (ref 0.0–1.2)
Total Protein: 8.1 g/dL (ref 6.5–8.1)

## 2024-11-01 LAB — CBC WITH DIFFERENTIAL/PLATELET
Abs Immature Granulocytes: 0.03 10*3/uL (ref 0.00–0.07)
Basophils Absolute: 0 10*3/uL (ref 0.0–0.1)
Basophils Relative: 0 %
Eosinophils Absolute: 0.1 10*3/uL (ref 0.0–0.5)
Eosinophils Relative: 1 %
HCT: 45.3 % (ref 39.0–52.0)
Hemoglobin: 14.6 g/dL (ref 13.0–17.0)
Immature Granulocytes: 0 %
Lymphocytes Relative: 19 %
Lymphs Abs: 1.8 10*3/uL (ref 0.7–4.0)
MCH: 30.6 pg (ref 26.0–34.0)
MCHC: 32.2 g/dL (ref 30.0–36.0)
MCV: 95 fL (ref 80.0–100.0)
Monocytes Absolute: 0.8 10*3/uL (ref 0.1–1.0)
Monocytes Relative: 9 %
Neutro Abs: 6.4 10*3/uL (ref 1.7–7.7)
Neutrophils Relative %: 71 %
Platelets: 330 10*3/uL (ref 150–400)
RBC: 4.77 MIL/uL (ref 4.22–5.81)
RDW: 12.1 % (ref 11.5–15.5)
WBC: 9.1 10*3/uL (ref 4.0–10.5)
nRBC: 0 % (ref 0.0–0.2)

## 2024-11-01 LAB — BASIC METABOLIC PANEL WITH GFR
Anion gap: 14 (ref 5–15)
BUN: 16 mg/dL (ref 6–20)
CO2: 20 mmol/L — ABNORMAL LOW (ref 22–32)
Calcium: 8.7 mg/dL — ABNORMAL LOW (ref 8.9–10.3)
Chloride: 109 mmol/L (ref 98–111)
Creatinine, Ser: 1.03 mg/dL (ref 0.61–1.24)
GFR, Estimated: >60 mL/min
Glucose, Bld: 125 mg/dL — ABNORMAL HIGH (ref 70–99)
Potassium: 3.6 mmol/L (ref 3.5–5.1)
Sodium: 143 mmol/L (ref 135–145)

## 2024-11-01 LAB — ETHANOL: Alcohol, Ethyl (B): 193 mg/dL — ABNORMAL HIGH

## 2024-11-01 LAB — ACETAMINOPHEN LEVEL: Acetaminophen (Tylenol), Serum: <10 ug/mL — ABNORMAL LOW (ref 10–30)

## 2024-11-01 LAB — SALICYLATE LEVEL: Salicylate Lvl: <7 mg/dL — ABNORMAL LOW (ref 7.0–30.0)

## 2024-11-01 MED ORDER — LORAZEPAM 1 MG PO TABS
1.0000 mg | ORAL_TABLET | Freq: Four times a day (QID) | ORAL | Status: DC | PRN
  Administered 2024-11-03: 1 mg via ORAL
  Filled 2024-11-01 (×2): qty 1

## 2024-11-01 MED ORDER — OLANZAPINE 5 MG PO TBDP
5.0000 mg | ORAL_TABLET | Freq: Every day | ORAL | Status: DC
  Administered 2024-11-02: 5 mg via ORAL
  Filled 2024-11-01: qty 1

## 2024-11-01 MED ORDER — THIAMINE MONONITRATE 100 MG PO TABS
100.0000 mg | ORAL_TABLET | Freq: Every day | ORAL | Status: DC
  Administered 2024-11-03: 100 mg via ORAL
  Filled 2024-11-01 (×2): qty 1

## 2024-11-01 MED ORDER — LOPERAMIDE HCL 2 MG PO CAPS: 2.0000 mg | ORAL_CAPSULE | ORAL | Status: DC | PRN

## 2024-11-01 MED ORDER — STERILE WATER FOR INJECTION IJ SOLN
INTRAMUSCULAR | Status: AC
  Filled 2024-11-01: qty 10

## 2024-11-01 MED ORDER — ADULT MULTIVITAMIN W/MINERALS CH
1.0000 | ORAL_TABLET | Freq: Every day | ORAL | Status: DC
  Administered 2024-11-03: 1 via ORAL
  Filled 2024-11-01 (×2): qty 1

## 2024-11-01 MED ORDER — LORAZEPAM 2 MG/ML IJ SOLN: 2.0000 mg | Freq: Four times a day (QID) | INTRAMUSCULAR | Status: DC | PRN

## 2024-11-01 MED ORDER — LORAZEPAM 2 MG/ML IJ SOLN
INTRAMUSCULAR | Status: AC
  Filled 2024-11-01: qty 1

## 2024-11-01 MED ORDER — HYDROXYZINE HCL 25 MG PO TABS
25.0000 mg | ORAL_TABLET | Freq: Four times a day (QID) | ORAL | Status: DC | PRN
  Administered 2024-11-02: 25 mg via ORAL
  Filled 2024-11-01: qty 1

## 2024-11-01 MED ORDER — HALOPERIDOL LACTATE 5 MG/ML IJ SOLN
INTRAMUSCULAR | Status: AC
  Filled 2024-11-01: qty 2

## 2024-11-01 MED ORDER — ZIPRASIDONE MESYLATE 20 MG IM SOLR: 20.0000 mg | Freq: Once | INTRAMUSCULAR | Status: AC

## 2024-11-01 MED ORDER — MIDAZOLAM HCL (PF) 2 MG/2ML IJ SOLN: 4.0000 mg | Freq: Once | INTRAMUSCULAR | Status: DC

## 2024-11-01 MED ORDER — DIPHENHYDRAMINE HCL 50 MG/ML IJ SOLN
INTRAMUSCULAR | Status: AC
  Filled 2024-11-01: qty 1

## 2024-11-01 MED ORDER — HALOPERIDOL LACTATE 5 MG/ML IJ SOLN
10.0000 mg | Freq: Four times a day (QID) | INTRAMUSCULAR | Status: DC | PRN
  Administered 2024-11-01: 10 mg via INTRAMUSCULAR

## 2024-11-01 MED ORDER — SODIUM CHLORIDE 0.9 % IV BOLUS
1000.0000 mL | Freq: Once | INTRAVENOUS | Status: AC
  Administered 2024-11-01: 1000 mL via INTRAVENOUS

## 2024-11-01 MED ORDER — LORAZEPAM 1 MG PO TABS
1.0000 mg | ORAL_TABLET | Freq: Once | ORAL | Status: AC
  Administered 2024-11-01: 1 mg via ORAL
  Filled 2024-11-01: qty 1

## 2024-11-01 MED ORDER — ONDANSETRON 4 MG PO TBDP: 4.0000 mg | ORAL_TABLET | Freq: Four times a day (QID) | ORAL | Status: DC | PRN

## 2024-11-01 MED ORDER — DIPHENHYDRAMINE HCL 50 MG/ML IJ SOLN: 50.0000 mg | Freq: Four times a day (QID) | INTRAMUSCULAR | Status: DC | PRN

## 2024-11-01 MED ORDER — ZIPRASIDONE MESYLATE 20 MG IM SOLR
INTRAMUSCULAR | Status: AC
  Administered 2024-11-01: 20 mg via INTRAMUSCULAR
  Filled 2024-11-01: qty 20

## 2024-11-01 MED ORDER — MIDAZOLAM HCL (PF) 2 MG/2ML IJ SOLN
2.0000 mg | Freq: Once | INTRAMUSCULAR | Status: AC
  Administered 2024-11-01: 2 mg via INTRAMUSCULAR
  Filled 2024-11-01: qty 2
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
## 2024-11-01 NOTE — ED Notes (Signed)
 Patient is cursing and screaming on the unit
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
## 2024-11-01 NOTE — ED Notes (Signed)
 Patient changed to purple scrubs, EKG and VS done, and Blood collected. Versed  given. Will continue to monitor.

## 2024-11-01 NOTE — ED Notes (Signed)
 Patient refusing to undress or wanting for us  to get vitals RN is aware.

## 2024-11-01 NOTE — ED Notes (Signed)
 Patient is yelling. Security and [employer] is at bedside

## 2024-11-01 NOTE — ED Provider Notes (Cosign Needed)
 " Physical Exam  BP 113/61   Pulse (!) 118   Temp 98.6 F (37 C) (Oral)   Resp 19   SpO2 95%   Physical Exam Vitals and nursing note reviewed.  Constitutional:      General: He is not in acute distress.    Appearance: Normal appearance. He is not ill-appearing, toxic-appearing or diaphoretic.     Comments: Agitated  HENT:     Head: Normocephalic and atraumatic.     Nose: Nose normal.  Eyes:     General: No scleral icterus.    Extraocular Movements: Extraocular movements intact.     Conjunctiva/sclera: Conjunctivae normal.  Pulmonary:     Effort: Pulmonary effort is normal. No respiratory distress.  Musculoskeletal:        General: Normal range of motion.     Cervical back: Normal range of motion.  Skin:    General: Skin is warm and dry.     Coloration: Skin is not jaundiced or pale.  Neurological:     Mental Status: He is alert and oriented to person, place, and time.     Gait: Gait normal.     Procedures  Procedures  ED Course / MDM    Medical Decision Making Amount and/or Complexity of Data Reviewed Labs: ordered. ECG/medicine tests: ordered.  Risk Prescription drug management.   Signout from [place] PA-C at shift change. Briefly, patient presents for IVC petitioned by his father. Patient with history of ADD and behavioral issues. Per reports, patient has not been compliant with medications or doctor's visits. He has been having escalating behaviors involving lots of fights and aggression. Reporting alcohol and drug use, notably marijuana. IVC paperwork filled out by previous provider.  During intake to the ED, patient became aggressive and combative with staff,  he was unable to be deescalated verbally and required Geodon  IM and Versed  IM.    Plan: Reassess labs after IV fluids and rest. If these are normal then patient should be medically cleared for TTS evaluation.    8:38 AM Reassessment performed. Patient appears agitated in the hallway speaking  rudely to security staff. Patient stating he would like to leave and doesn't understand how he can be committed to the hospital by someone he states is not his biological father. Spoke to patient about the necessary steps in the process of IVC and patient appeared to calm down. No medical complaints at this time. Patient then escalated again wanting to leave and restraints and PO Ativan  ordered.   When TTS attempted to consult, patient was being aggressive and attempting to leave. He was given IM Benadryl , Ativan , and Haldol .   Labs and imaging personally reviewed and interpreted including: Sinus tachycardia on initial EKG. Elevated ethanol level. Initial labs show anion gap of 25 and bicarb of 15, likely secondary to ETOH and hyperventilation as patient became combative in the ED prior to lab draw.   Reviewed additional pertinent lab work and imaging with patient at bedside including: Repeat labwork shows improvement, anion gap 14 and bicarb 20. Urine drug screen pending.   Vitals:   11/01/24 0430 11/01/24 0445 11/01/24 0530 11/01/24 0700  BP: 102/67 90/62 113/61 (!) 114/57  Pulse: (!) 114 99 (!) 118 92  Resp: 18 20 19 18   Temp:      TempSrc:      SpO2: 95% 97% 95% 97%   Most current vital signs reviewed and are as follows: BP 113/61   Pulse (!) 118   Temp  98.6 F (37 C) (Oral)   Resp 19   SpO2 95%     Lab work improved on redraw after IVF and rest. Patient is medically cleared.   This note was produced using Electronics Engineer. While the provider has reviewed and verified all clinical information, transcription errors may remain.    Rosina Almarie LABOR, PA-C 11/01/24 1438  "

## 2024-11-01 NOTE — ED Notes (Signed)
 Patient combative , agitated, and restless. Patient unable to follow commands. Security at bedside. Patient restrained in the bed. Geodon  given.

## 2024-11-01 NOTE — ED Notes (Signed)
 Patient out of restraints. Patient sleeping, breathes easy and unlabored.
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
## 2024-11-01 NOTE — ED Notes (Signed)
 Rounding sheets were made for this patient.

## 2024-11-01 NOTE — ED Notes (Signed)
 Patient awake. Asked about seeing a Provider.  Notified Provider.

## 2024-11-01 NOTE — ED Notes (Signed)
 Medication and Updated vitals were held due to the pt being asleep.

## 2024-11-01 NOTE — Consult Note (Signed)
 Patient observed aggressively running through the doors trying to elope. He was noted to be physically aggressive towards security and nursing staff. He was placed in restraints and administered Haldol  10 mg IM, Ativan  2 mg IM and Benadryl  50 mg IM at 1042 AM.

## 2024-11-01 NOTE — ED Notes (Signed)
 Patient is resting comfortably.

## 2024-11-01 NOTE — ED Notes (Signed)
 Patient was going to be moved per Charge. Nurse.  Security and Nurse talked to patient about being moved..  Patient became agitated about being in ED and not being see by [facility] provider. RN tried to explai process. Patient continued to be agitated, yelling, Patient ran off to TCU where he trying to leave and became combative. Security, Set Designer present.  Patient continued to be aggressive and combative.  Orders placed.  Patient placed in restraints and medication give per order (see MAR).

## 2024-11-01 NOTE — ED Notes (Signed)
 Per provider we will get labs and vitals once patient has calm down.
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
## 2024-11-01 NOTE — ED Notes (Signed)
 Patient report obtained and patient was not on restraints during the time frame I cared for him.

## 2024-11-01 NOTE — ED Notes (Signed)
Patient is in restraints.

## 2024-11-01 NOTE — ED Provider Notes (Signed)
 I provided a substantive portion of the care of this patient.  I personally made/approved the management plan for this patient and take responsibility for the patient management.  EKG Interpretation Date/Time:  Wednesday November 01 2024 04:13:13 EST Ventricular Rate:  118 PR Interval:  130 QRS Duration:  90 QT Interval:  308 QTC Calculation: 432 R Axis:   100  Text Interpretation: Sinus tachycardia Right atrial enlargement Borderline right axis deviation Borderline repolarization abnormality Confirmed by Melvenia Motto ([id number]) on 11/01/2024 5:30:56 AM   Patient's EKG shows sinus cardia.  Patient here with aggression that required multiple medications as well as physical restraints.  Labs reviewed and he is now medically cleared.  Psychiatric team will see patient   Dasie Faden, MD 11/01/24 1349

## 2024-11-01 NOTE — Consult Note (Signed)
 Beth, RN., informed this provider that the patient has been released from restraints. Provider observed patient asleep in no acute distress, and respiration are even and unlabored.

## 2024-11-01 NOTE — ED Notes (Signed)
 Patient items have been placed in two belonging bags. Placed in cabinets 9-12  Jacket ,pants, belt , necklace , Vape

## 2024-11-01 NOTE — ED Notes (Signed)
 Pt refused Temp update at this time.

## 2024-11-02 LAB — URINE DRUG SCREEN
Amphetamines: NEGATIVE
Barbiturates: NEGATIVE
Benzodiazepines: POSITIVE — AB
Cocaine: NEGATIVE
Fentanyl: NEGATIVE
Methadone Scn, Ur: NEGATIVE
Opiates: NEGATIVE
Tetrahydrocannabinol: POSITIVE — AB

## 2024-11-02 NOTE — ED Notes (Signed)
 Pt out of shower, ambulated back to bathroom with steady gait. Pt personal socks placed with belongings in locker 40. Calm and cooperative, denies any issues at this time.

## 2024-11-02 NOTE — Progress Notes (Signed)
 Inpatient Psychiatric Referral  Patient was recommended inpatient per Wyline Pizza, NP. There are no available beds at [facility], per [facility]. Patient was referred to the following out of network facilities:  Destination  Service Provider Address Phone Fax  [facility]  [address] [place] [address] [phone]  [facility] [address]  [address], [place] [address] [phone]  [facility]  [address], [place] [address] [phone]  [facility]  [address], [place] [address] [phone]  [facility]  [address] [place] [address] [phone]  [facility] EFAX  [address] [place], [place] [phone]  [facility]  [address], [place] [address] [phone]  [facility]  [address] [place] [address] [phone]    Situation ongoing, CSW to continue following and update chart as more information becomes available.   Harrie Sofia MSW, LCSWA 11/02/2024

## 2024-11-02 NOTE — ED Notes (Signed)
 Report given to Devere Sharps, RN at [facility], states they are ready to receive pr.  Called [facility] for transport, voicemail left.

## 2024-11-02 NOTE — ED Notes (Signed)
 Pt is alert and laying in bed. Pt has been cooperative with no incidents.

## 2024-11-02 NOTE — Progress Notes (Signed)
 Pt has been accepted to [facility] on 11/02/2024 . Bed assignment: [facility]  Pt meets inpatient criteria per Wyline Pizza, NP   Attending Physician will be Millie Manners, MD  Report can be called to: [phone] (this is a pager, please leave call-back number when giving report)  Pt can arrive after ASAP

## 2024-11-02 NOTE — ED Provider Notes (Signed)
 Emergency Medicine Observation Re-evaluation Note  Ricardo Joseph is a 24 y.o. male, seen on rounds today.  Pt initially presented to the ED for complaints of IVC Currently, the patient is asleep.  Physical Exam  BP (!) 116/95 (BP Location: Right Arm)   Pulse (!) 107   Temp 97.8 F (36.6 C) (Axillary)   Resp 18   SpO2 100%  Physical Exam General: asleep Cardiac: asleep Lungs: asleep Psych: asleep  ED Course / MDM  EKG:EKG Interpretation Date/Time:  Wednesday November 01 2024 04:13:13 EST Ventricular Rate:  118 PR Interval:  130 QRS Duration:  90 QT Interval:  308 QTC Calculation: 432 R Axis:   100  Text Interpretation: Sinus tachycardia Right atrial enlargement Borderline right axis deviation Borderline repolarization abnormality Confirmed by Melvenia Motto ([id number]) on 11/01/2024 5:30:56 AM  I have reviewed the labs performed to date as well as medications administered while in observation.  Recent changes in the last 24 hours include multiple psychiatric meds given yesterday for agitation.  Plan  Current plan is for inpatient psychiatric admission.    Freddi Hamilton, MD 11/02/24 [phone]

## 2024-11-02 NOTE — ED Notes (Signed)
 Waiting for security to return to unit to accompany staff for vitals and med-pass.

## 2024-11-02 NOTE — ED Notes (Addendum)
 Pt has become agitated, yelling on the phone with his father. Pt visibly upset after getting off of the phone with his father. Pt was able to be redirected easily. Pt requested medication for anxiety and agitation.   PRN given

## 2024-11-03 NOTE — ED Provider Notes (Signed)
 Emergency Medicine Observation Re-evaluation Note  Ricardo Joseph is a 24 y.o. male, seen on rounds today.  Pt initially presented to the ED for complaints of IVC due to agitation, combativeness, reported medication noncompliance Currently, the patient is resting.  Physical Exam  BP 121/62 (BP Location: Right Arm)   Pulse 83   Temp (!) 97.3 F (36.3 C) (Oral)   Resp 14   SpO2 99%  Physical Exam General: Young adult male resting Cardiac: Regular rate rhythm Lungs: No increased work of breathing Psych: Currently calm  ED Course / MDM  EKG:EKG Interpretation Date/Time:  Wednesday November 01 2024 04:13:13 EST Ventricular Rate:  118 PR Interval:  130 QRS Duration:  90 QT Interval:  308 QTC Calculation: 432 R Axis:   100  Text Interpretation: Sinus tachycardia Right atrial enlargement Borderline right axis deviation Borderline repolarization abnormality Confirmed by Melvenia Motto ([id number]) on 11/01/2024 5:30:56 AM  I have reviewed the labs performed to date as well as medications administered while in observation.  Recent changes in the last 24 hours include no notable changes.  Plan  Current plan is for as for today to inpatient behavioral health facility [facility], Dr. Grover is the accepting physician.    Garrick Charleston, MD 11/03/24 [phone]

## 2024-11-03 NOTE — ED Notes (Signed)
 Patient off unit to [facility] per provider. Patient alert, calm, cooperative and  no s/s of distress at time of discharge. Discharge information and belongings given to sheriff staff for transport. Patient ambulatory off unit, escorted and transported by [facility] staff.
